# Patient Record
Sex: Female | Born: 1985 | Race: Asian | Hispanic: No | Marital: Married
Health system: Southern US, Community
[De-identification: ages and names within clinical notes are randomized; demographics above are authoritative.]

---

## 2017-10-06 ENCOUNTER — Other Ambulatory Visit: Payer: Self-pay

## 2017-10-06 ENCOUNTER — Encounter (HOSPITAL_COMMUNITY): Payer: Self-pay | Admitting: Emergency Medicine

## 2017-10-06 ENCOUNTER — Emergency Department (HOSPITAL_COMMUNITY)
Admission: EM | Admit: 2017-10-06 | Discharge: 2017-10-07 | Disposition: A | Discharge status: Home / Self Care | Payer: Managed Care, Other (non HMO) | Attending: Emergency Medicine | Admitting: Emergency Medicine

## 2017-10-06 DIAGNOSIS — R109 Unspecified abdominal pain: Secondary | ICD-10-CM

## 2017-10-06 DIAGNOSIS — R1031 Right lower quadrant pain: Secondary | ICD-10-CM | POA: Insufficient documentation

## 2017-10-06 DIAGNOSIS — N83201 Unspecified ovarian cyst, right side: Secondary | ICD-10-CM | POA: Insufficient documentation

## 2017-10-06 LAB — CBC WITH DIFFERENTIAL/PLATELET
BASOS ABS: 0 10*3/uL (ref 0.0–0.1)
BASOS PCT: 0 %
EOS ABS: 0.3 10*3/uL (ref 0.0–0.7)
EOS PCT: 3 %
HEMATOCRIT: 41.8 % (ref 36.0–46.0)
Hemoglobin: 13.8 g/dL (ref 12.0–15.0)
Lymphocytes Relative: 35 %
Lymphs Abs: 4.3 10*3/uL — ABNORMAL HIGH (ref 0.7–4.0)
MCH: 29.3 pg (ref 26.0–34.0)
MCHC: 33 g/dL (ref 30.0–36.0)
MCV: 88.7 fL (ref 78.0–100.0)
MONO ABS: 0.5 10*3/uL (ref 0.1–1.0)
MONOS PCT: 4 %
Neutro Abs: 7.3 10*3/uL (ref 1.7–7.7)
Neutrophils Relative %: 58 %
PLATELETS: 289 10*3/uL (ref 150–400)
RBC: 4.71 MIL/uL (ref 3.87–5.11)
RDW: 12.7 % (ref 11.5–15.5)
WBC: 12.4 10*3/uL — ABNORMAL HIGH (ref 4.0–10.5)

## 2017-10-06 LAB — URINALYSIS, ROUTINE W REFLEX MICROSCOPIC
BACTERIA UA: NONE SEEN
BILIRUBIN URINE: NEGATIVE
Glucose, UA: NEGATIVE mg/dL
KETONES UR: NEGATIVE mg/dL
NITRITE: NEGATIVE
PROTEIN: NEGATIVE mg/dL
SPECIFIC GRAVITY, URINE: 1.012 (ref 1.005–1.030)
pH: 5 (ref 5.0–8.0)

## 2017-10-06 LAB — I-STAT BETA HCG BLOOD, ED (MC, WL, AP ONLY)

## 2017-10-06 LAB — COMPREHENSIVE METABOLIC PANEL
ALBUMIN: 4.2 g/dL (ref 3.5–5.0)
ALK PHOS: 108 U/L (ref 38–126)
ALT: 122 U/L — ABNORMAL HIGH (ref 14–54)
AST: 68 U/L — ABNORMAL HIGH (ref 15–41)
Anion gap: 10 (ref 5–15)
BUN: 10 mg/dL (ref 6–20)
CALCIUM: 9.7 mg/dL (ref 8.9–10.3)
CHLORIDE: 101 mmol/L (ref 101–111)
CO2: 23 mmol/L (ref 22–32)
Creatinine, Ser: 0.57 mg/dL (ref 0.44–1.00)
GFR calc non Af Amer: >60 mL/min (ref >60)
Glucose, Bld: 125 mg/dL — ABNORMAL HIGH (ref 65–99)
Potassium: 3.8 mmol/L (ref 3.5–5.1)
SODIUM: 134 mmol/L — AB (ref 135–145)
Total Bilirubin: 0.7 mg/dL (ref 0.3–1.2)
Total Protein: 7.7 g/dL (ref 6.5–8.1)

## 2017-10-06 LAB — LIPASE, BLOOD: LIPASE: 33 U/L (ref 11–51)

## 2017-10-06 NOTE — ED Triage Notes (Signed)
Pt reports lower right abd pain, sent by urgent care. Denies N/V/D, no GI symptoms.

## 2017-10-06 NOTE — ED Provider Notes (Signed)
MSE was initiated and I personally evaluated the patient and placed orders (if any) at  8:03 PM on October 06, 2017.  The patient appears stable so that the remainder of the MSE may be completed by another provider.   Patient placed in Quick Look pathway, seen and evaluated   Chief Complaint: right lower quadrant abdominal pain  HPI:   Patient sent from urgent care for right lower quadrant abdominal abdominal pain, onset 3 days ago, colicky, no urinary symptoms.  She does have mild back pain.  No fevers or chills  ROS: Back pain, right lower quadrant abdominal pain (one)  Physical Exam:   Gen: No distress  Neuro: Awake and Alert  Skin: Warm    Focused Exam: Mildly tender to palpation in the right lower quadrant without guarding or rebound   Initiation of care has begun. The patient has been counseled on the process, plan, and necessity for staying for the completion/evaluation, and the remainder of the medical screening examination    Arthor CaptainHarris, Rhyleigh Grassel, PA-C 10/06/17 2040    Melene PlanFloyd, Dan, DO 10/06/17 2059

## 2017-10-07 ENCOUNTER — Emergency Department (HOSPITAL_COMMUNITY): Payer: Managed Care, Other (non HMO)

## 2017-10-07 NOTE — ED Provider Notes (Signed)
[facility] EMERGENCY DEPARTMENT Provider Note   CSN: [id number] Arrival date & time: 10/06/17  1827     History   Chief Complaint Chief Complaint  Patient presents with  . Abdominal Pain    HPI Lindsay Vaughn is a 32 y.o. female.  HPI Patient is a 32 year old female reports 3-4 days of right lower quadrant pain with some radiation towards her back.  No dysuria or urinary frequency.  No vaginal complaints.  Last normal menstrual cycle was a week and half ago.  She is on birth control.  Patient is never had symptoms of pain like this before.  No history of kidney stones.  No history of ovarian cyst.  She does have a family history of ovarian cysts.  Pain is mild in severity.  She does not need anything for pain at this time per the patient.  She has been using ibuprofen and Tylenol with some improvement   History reviewed. No pertinent past medical history.  There are no active problems to display for this patient.   History reviewed. No pertinent surgical history.   OB History   None      Home Medications    Prior to Admission medications   Medication Sig Start Date End Date Taking? Authorizing Provider  Norgestimate-Ethinyl Estradiol Triphasic (TRINESSA LO) 0.18/0.215/0.25 MG-25 MCG tab Take by mouth.   Yes [provider]    Family History No family history on file.  Social History Social History   Tobacco Use  . Smoking status: Not on file  Substance Use Topics  . Alcohol use: Not on file  . Drug use: Not on file     Allergies   Patient has no known allergies.   Review of Systems Review of Systems  All other systems reviewed and are negative.    Physical Exam Updated Vital Signs BP 124/81   Pulse 63   Temp 98.2 F (36.8 C)   Resp 16   Ht 5\' 2"  (1.575 m)   Wt 74.4 kg (164 lb)   LMP 09/24/2017 (Exact Date)   SpO2 96%   BMI 30.00 kg/m   Physical Exam  Constitutional: She is oriented to person, place, and  time. She appears well-developed and well-nourished.  HENT:  Head: Normocephalic.  Eyes: EOM are normal.  Neck: Normal range of motion.  Cardiovascular: Normal rate.  Pulmonary/Chest: Effort normal.  Abdominal: She exhibits no distension. There is no tenderness.  Musculoskeletal: Normal range of motion.  Neurological: She is alert and oriented to person, place, and time.  Psychiatric: She has a normal mood and affect.  Nursing note and vitals reviewed.    ED Treatments / Results  Labs (all labs ordered are listed, but only abnormal results are displayed) Labs Reviewed  COMPREHENSIVE METABOLIC PANEL - Abnormal; Notable for the following components:      Result Value   Sodium 134 (*)    Glucose, Bld 125 (*)    AST 68 (*)    ALT 122 (*)    All other components within normal limits  URINALYSIS, ROUTINE W REFLEX MICROSCOPIC - Abnormal; Notable for the following components:   Hgb urine dipstick SMALL (*)    Leukocytes, UA TRACE (*)    Squamous Epithelial / LPF 0-5 (*)    All other components within normal limits  CBC WITH DIFFERENTIAL/PLATELET - Abnormal; Notable for the following components:   WBC 12.4 (*)    Lymphs Abs 4.3 (*)    All other  components within normal limits  LIPASE, BLOOD  I-STAT BETA HCG BLOOD, ED ([facility], [facility], [facility] ONLY)  I-STAT BETA HCG BLOOD, ED ([facility], [facility], [facility] ONLY)    EKG None  Radiology US Transvaginal Non-ob  Result Date: 10/07/2017 CLINICAL DATA:  Acute onset of right lower quadrant abdominal pain. EXAM: TRANSABDOMINAL AND TRANSVAGINAL ULTRASOUND OF PELVIS DOPPLER ULTRASOUND OF OVARIES TECHNIQUE: Both transabdominal and transvaginal ultrasound examinations of the pelvis were performed. Transabdominal technique was performed for global imaging of the pelvis including uterus, ovaries, adnexal regions, and pelvic cul-de-sac. It was necessary to proceed with endovaginal exam following the transabdominal exam to visualize the uterus and ovaries in greater detail.  Color and duplex Doppler ultrasound was utilized to evaluate blood flow to the ovaries. COMPARISON:  CT of the abdomen and pelvis performed earlier today at 4:01 a.m. FINDINGS: Uterus Measurements: 7.4 x 3.2 x 3.9 cm. No fibroids or other mass visualized. Endometrium Thickness: 0.4 cm.  No focal abnormality visualized. Right ovary Measurements: 5.8 x 4.3 x 5.3 cm. A large simple appearing 5.1 x 3.7 x 4.7 cm cyst is noted at the right ovary. Left ovary Obscured by overlying bowel. Pulsed Doppler evaluation of right ovarian tissue demonstrates normal low-resistance arterial and venous waveforms. Other findings Trace free fluid is noted within the pelvic cul-de-sac. IMPRESSION: 1. Large simple appearing 5.1 cm cyst at the right ovary, likely physiologic in nature. 2. No evidence for ovarian torsion. Left ovary is obscured by overlying bowel. 3. Uterus unremarkable in appearance. Electronically Signed   By: Roanna Raider M.D.   On: 10/07/2017 05:41   US Pelvis Complete  Result Date: 10/07/2017 CLINICAL DATA:  Acute onset of right lower quadrant abdominal pain. EXAM: TRANSABDOMINAL AND TRANSVAGINAL ULTRASOUND OF PELVIS DOPPLER ULTRASOUND OF OVARIES TECHNIQUE: Both transabdominal and transvaginal ultrasound examinations of the pelvis were performed. Transabdominal technique was performed for global imaging of the pelvis including uterus, ovaries, adnexal regions, and pelvic cul-de-sac. It was necessary to proceed with endovaginal exam following the transabdominal exam to visualize the uterus and ovaries in greater detail. Color and duplex Doppler ultrasound was utilized to evaluate blood flow to the ovaries. COMPARISON:  CT of the abdomen and pelvis performed earlier today at 4:01 a.m. FINDINGS: Uterus Measurements: 7.4 x 3.2 x 3.9 cm. No fibroids or other mass visualized. Endometrium Thickness: 0.4 cm.  No focal abnormality visualized. Right ovary Measurements: 5.8 x 4.3 x 5.3 cm. A large simple appearing 5.1 x 3.7  x 4.7 cm cyst is noted at the right ovary. Left ovary Obscured by overlying bowel. Pulsed Doppler evaluation of right ovarian tissue demonstrates normal low-resistance arterial and venous waveforms. Other findings Trace free fluid is noted within the pelvic cul-de-sac. IMPRESSION: 1. Large simple appearing 5.1 cm cyst at the right ovary, likely physiologic in nature. 2. No evidence for ovarian torsion. Left ovary is obscured by overlying bowel. 3. Uterus unremarkable in appearance. Electronically Signed   By: Roanna Raider M.D.   On: 10/07/2017 05:41   [place] Art/ven Flow Abd Pelv Doppler  Result Date: 10/07/2017 CLINICAL DATA:  Acute onset of right lower quadrant abdominal pain. EXAM: TRANSABDOMINAL AND TRANSVAGINAL ULTRASOUND OF PELVIS DOPPLER ULTRASOUND OF OVARIES TECHNIQUE: Both transabdominal and transvaginal ultrasound examinations of the pelvis were performed. Transabdominal technique was performed for global imaging of the pelvis including uterus, ovaries, adnexal regions, and pelvic cul-de-sac. It was necessary to proceed with endovaginal exam following the transabdominal exam to visualize the uterus and ovaries in greater detail. Color and duplex  Doppler ultrasound was utilized to evaluate blood flow to the ovaries. COMPARISON:  CT of the abdomen and pelvis performed earlier today at 4:01 a.m. FINDINGS: Uterus Measurements: 7.4 x 3.2 x 3.9 cm. No fibroids or other mass visualized. Endometrium Thickness: 0.4 cm.  No focal abnormality visualized. Right ovary Measurements: 5.8 x 4.3 x 5.3 cm. A large simple appearing 5.1 x 3.7 x 4.7 cm cyst is noted at the right ovary. Left ovary Obscured by overlying bowel. Pulsed Doppler evaluation of right ovarian tissue demonstrates normal low-resistance arterial and venous waveforms. Other findings Trace free fluid is noted within the pelvic cul-de-sac. IMPRESSION: 1. Large simple appearing 5.1 cm cyst at the right ovary, likely physiologic in nature. 2. No evidence for  ovarian torsion. Left ovary is obscured by overlying bowel. 3. Uterus unremarkable in appearance. Electronically Signed   By: Roanna Raider M.D.   On: 10/07/2017 05:41   Ct Renal Stone Study  Result Date: 10/07/2017 CLINICAL DATA:  Acute onset of right lower quadrant abdominal pain. EXAM: CT ABDOMEN AND PELVIS WITHOUT CONTRAST TECHNIQUE: Multidetector CT imaging of the abdomen and pelvis was performed following the standard protocol without IV contrast. COMPARISON:  None. FINDINGS: Lower chest: The visualized lung bases are grossly clear. The visualized portions of the mediastinum are unremarkable. Hepatobiliary: The liver is unremarkable in appearance. The gallbladder is unremarkable in appearance. The common bile duct remains normal in caliber. Pancreas: The pancreas is within normal limits. Spleen: The spleen is unremarkable in appearance. Adrenals/Urinary Tract: The adrenal glands are unremarkable in appearance. The kidneys are within normal limits. There is no evidence of hydronephrosis. No renal or ureteral stones are identified. No perinephric stranding is seen. Stomach/Bowel: The stomach is unremarkable in appearance. The small bowel is within normal limits. The appendix is normal in caliber, without evidence of appendicitis. The colon is unremarkable in appearance. Vascular/Lymphatic: The abdominal aorta is unremarkable in appearance. The inferior vena cava is grossly unremarkable. No retroperitoneal lymphadenopathy is seen. No pelvic sidewall lymphadenopathy is identified. Reproductive: The bladder is mildly distended and grossly unremarkable. The uterus is unremarkable in appearance. A 5.3 cm right adnexal cystic focus is noted. The left ovary is unremarkable in appearance. Other: No additional soft tissue abnormalities are seen. Musculoskeletal: No acute osseous abnormalities are identified. The visualized musculature is unremarkable in appearance. IMPRESSION: 1. No acute abnormality seen to explain  the patient's symptoms. 2. 5.3 cm right adnexal cystic lesion noted. Pelvic ultrasound would be helpful for further evaluation, when and as deemed clinically appropriate. Electronically Signed   By: Roanna Raider M.D.   On: 10/07/2017 04:05    Procedures Procedures (including critical care time)  Medications Ordered in ED Medications - No data to display   Initial Impression / Assessment and Plan / ED Course  I have reviewed the triage vital signs and the nursing notes.  Pertinent labs & imaging results that were available during my care of the patient were reviewed by me and considered in my medical decision making (see chart for details).     Differential diagnosis: Ovarian cyst versus ovarian torsion versus renal colic versus atypical presentation of appendicitis  Patient with symptoms concerning for renal colic.  Patient will undergo CT imaging to further define the pathology.  There is a small amount of blood in the urine.  CT scan demonstrates right adnexal cyst.  Negative for ureteral stone.  Patient will undergo pelvic ultrasound to further delineate into the check for flow in the right ovary.  7:00 AM Patient continues to feel well at this time.  She does have flow to the right ovary.  She does have a large right ovarian cyst.  This is likely causing her pain.  She was given instructions to return the emergency department for any new or worsening symptoms including worsening pain in the setting of nausea vomiting.  She understands that she is at risk for torsion given the size of her cyst.  No signs to suggest this at this time.  Close outpatient gynecology follow-up.  She has a gynecologist at an upcoming scheduled appointment.  Final Clinical Impressions(s) / ED Diagnoses   Final diagnoses:  Acute abdominal pain  Right ovarian cyst    ED Discharge Orders    None       Azalia Bilisampos, Jaselle Pryer, MD 10/07/17 [phone]

## 2017-10-07 NOTE — Discharge Instructions (Addendum)
Follow up with your gynecologist as scheduled  Take motrin and tylenol for your pain

## 2019-02-06 ENCOUNTER — Telehealth: Payer: Managed Care, Other (non HMO) | Admitting: Nurse Practitioner

## 2019-02-06 DIAGNOSIS — T23252A Burn of second degree of left palm, initial encounter: Secondary | ICD-10-CM

## 2019-02-06 MED ORDER — SILVER SULFADIAZINE 1 % EX CREA: 1.0000 "application " | TOPICAL_CREAM | Freq: Every day | CUTANEOUS | 0 refills | Status: AC
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
## 2019-07-02 IMAGING — CT CT RENAL STONE PROTOCOL
2 of 4 series · 16 of 46 positions shown, 18 images · non-contrast
Comparison: None.

CLINICAL DATA: Acute onset of right lower quadrant abdominal pain.

EXAM:
CT ABDOMEN AND PELVIS WITHOUT CONTRAST
TECHNIQUE: Multidetector CT imaging of the abdomen and pelvis was performed
following the standard protocol without IV contrast.

[Series 3: renal stone 5.0 · axial · 0.82mm/px · z∈[+774,+1184]mm · 13 of 90 slices shown, 15 images]
[im 4/90  soft-tissue]
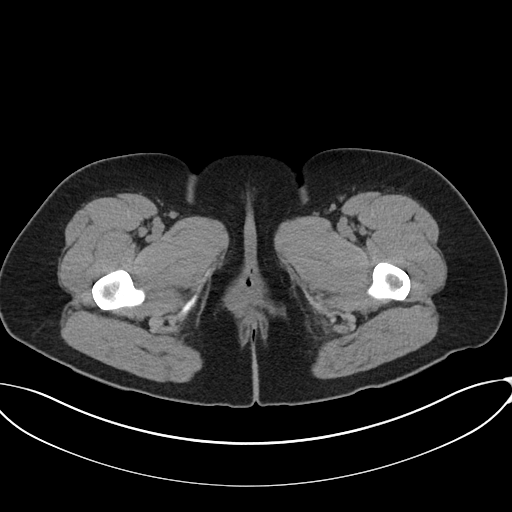
[im 4/90  bone]
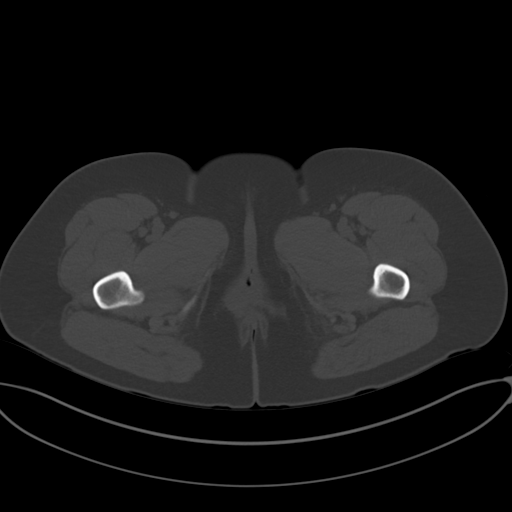
[im 11/90  soft-tissue]
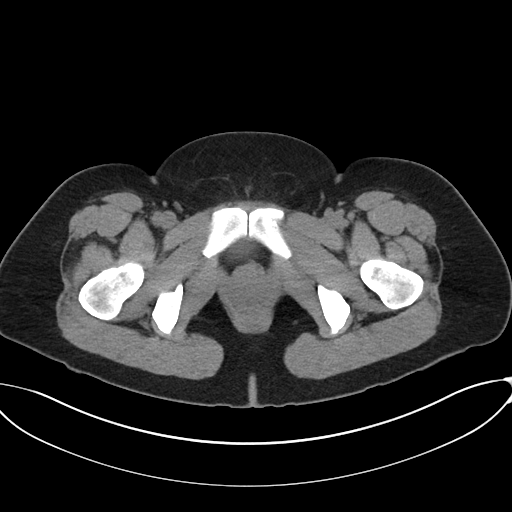
[im 18/90  soft-tissue]
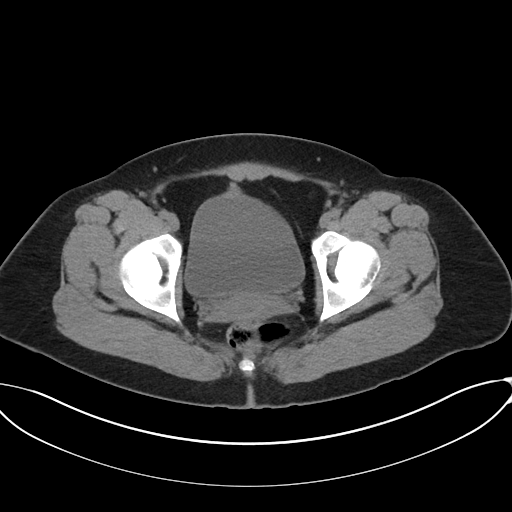
[im 25/90  soft-tissue]
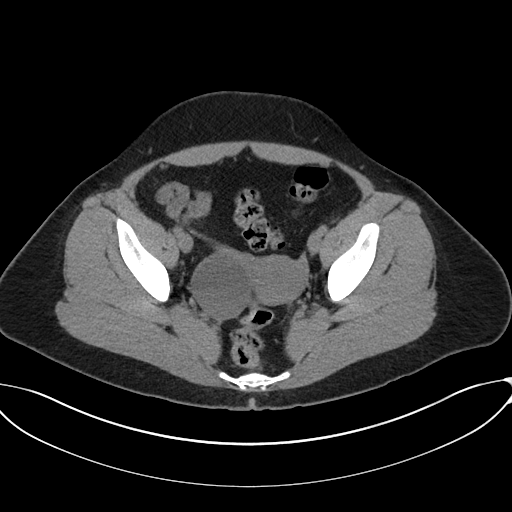
[im 33/90  soft-tissue]
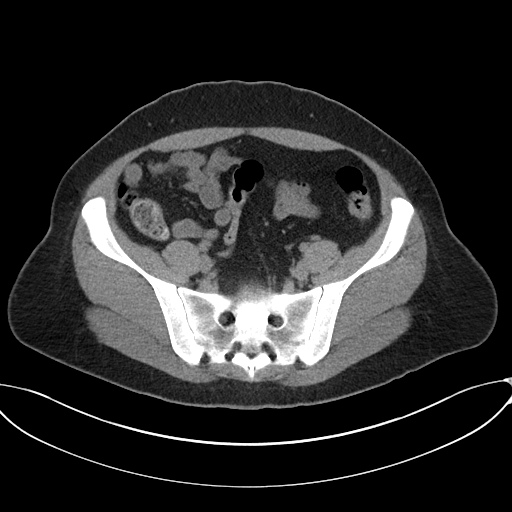
[im 40/90  soft-tissue]
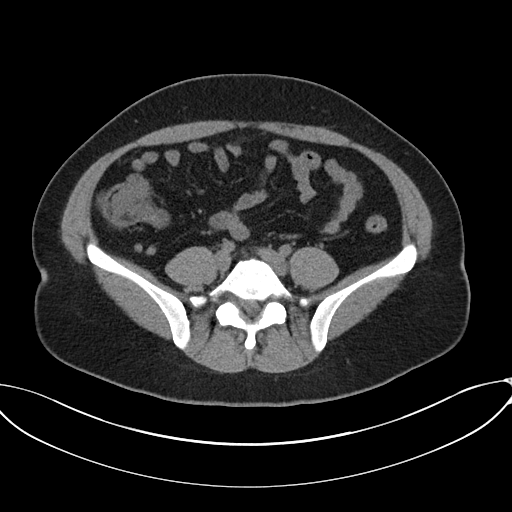
[im 47/90  soft-tissue]
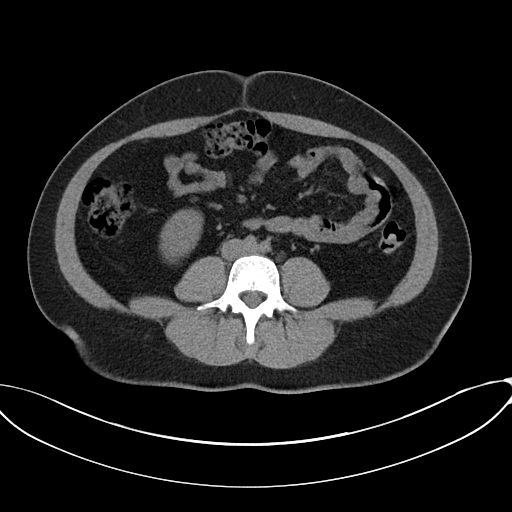
[im 50/90  soft-tissue]
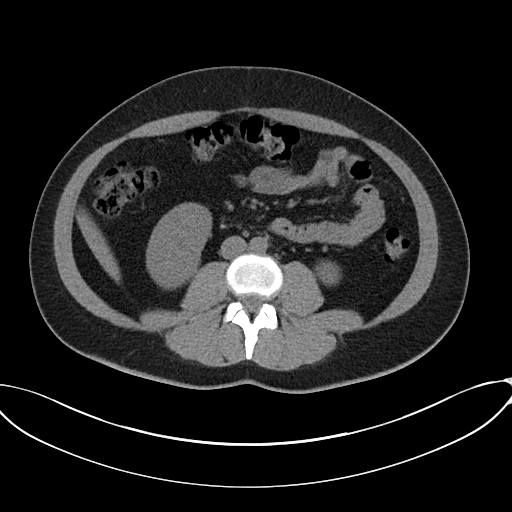
[im 57/90  soft-tissue]
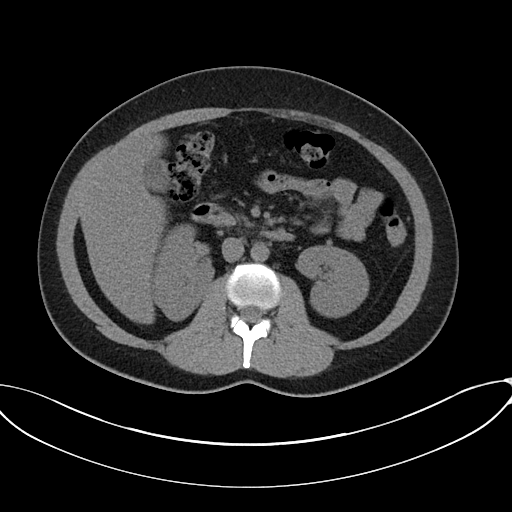
[im 57/90  bone]
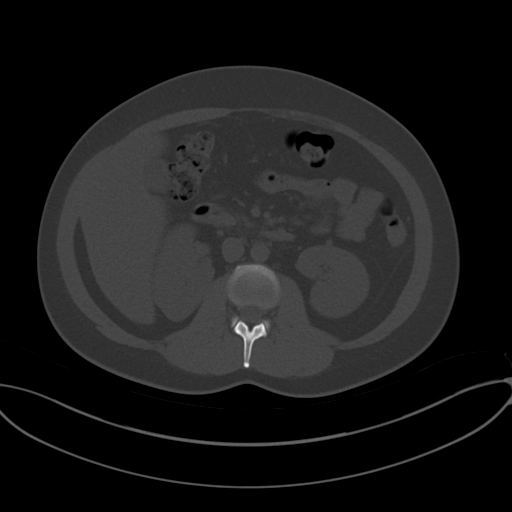
[im 65/90  soft-tissue]
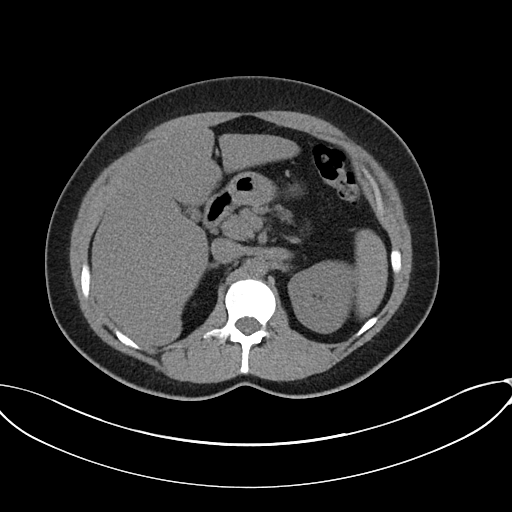
[im 72/90  soft-tissue]
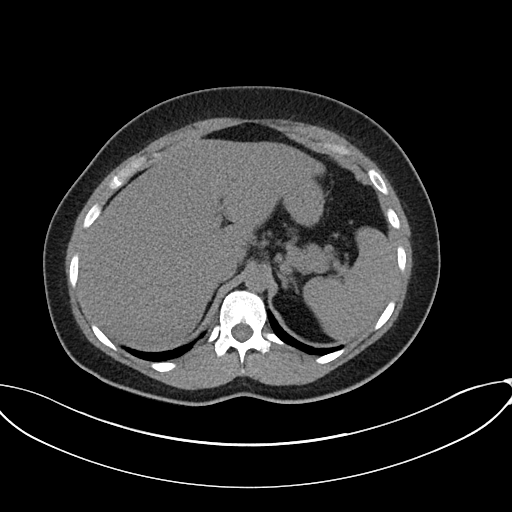
[im 79/90  soft-tissue]
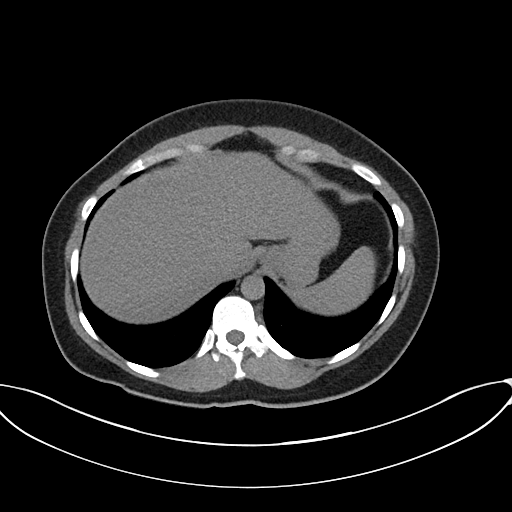
[im 86/90  soft-tissue]
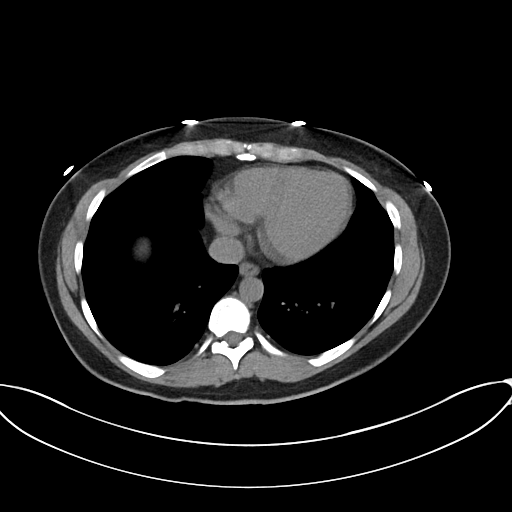

[Series 6: renal stone 3.0 cor · coronal · 0.78mm/px · 3 of 91 slices shown]
[im 31/91  soft-tissue]
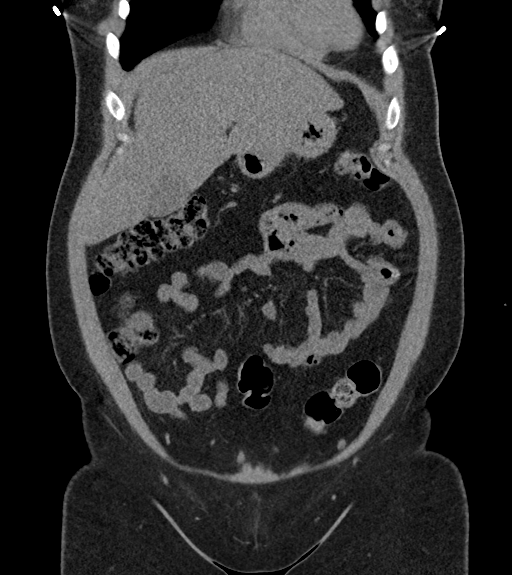
[im 41/91  soft-tissue]
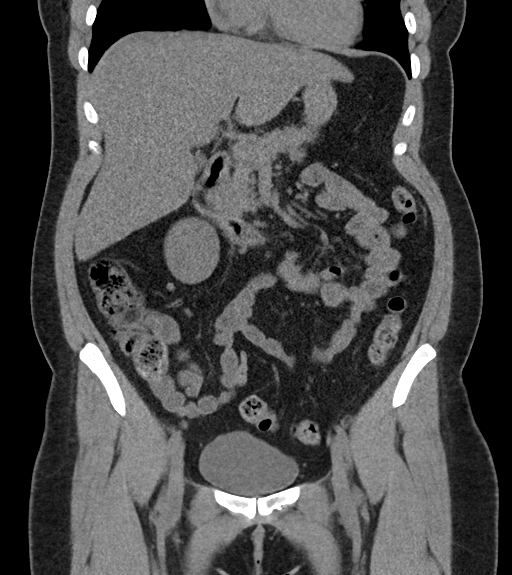
[im 51/91  soft-tissue]
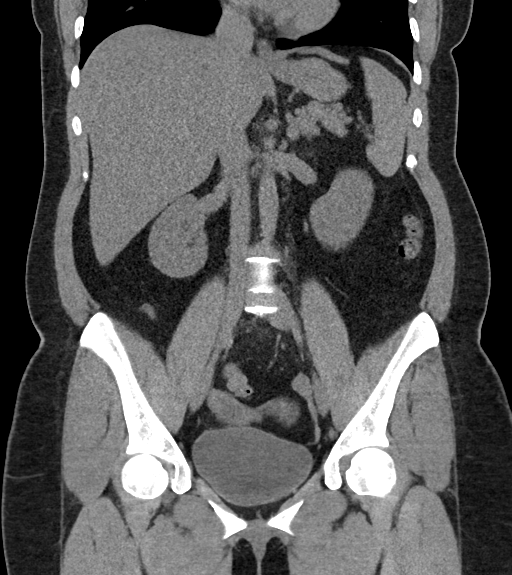

[16 of 46 positions shown; findings below may reference images not displayed]

FINDINGS: Lower chest: The visualized lung bases are grossly clear. The
visualized portions of the mediastinum are unremarkable.

Hepatobiliary: The liver is unremarkable in appearance. The
gallbladder is unremarkable in appearance. The common bile duct
remains normal in caliber.

Pancreas: The pancreas is within normal limits.

Spleen: The spleen is unremarkable in appearance.

Adrenals/Urinary Tract: The adrenal glands are unremarkable in
appearance. The kidneys are within normal limits. There is no
evidence of hydronephrosis. No renal or ureteral stones are
identified. No perinephric stranding is seen.

Stomach/Bowel: The stomach is unremarkable in appearance. The small
bowel is within normal limits. The appendix is normal in caliber,
without evidence of appendicitis. The colon is unremarkable in
appearance.

Vascular/Lymphatic: The abdominal aorta is unremarkable in
appearance. The inferior vena cava is grossly unremarkable. No
retroperitoneal lymphadenopathy is seen. No pelvic sidewall
lymphadenopathy is identified.

Reproductive: The bladder is mildly distended and grossly
unremarkable. The uterus is unremarkable in appearance. A 5.3 cm
right adnexal cystic focus is noted. The left ovary is unremarkable
in appearance.

Other: No additional soft tissue abnormalities are seen.

Musculoskeletal: No acute osseous abnormalities are identified. The
visualized musculature is unremarkable in appearance.
IMPRESSION: 1. No acute abnormality seen to explain the patient's symptoms.
2. 5.3 cm right adnexal cystic lesion noted. Pelvic ultrasound would
be helpful for further evaluation, when and as deemed clinically
appropriate.
